# Patient Record
Sex: Male | Born: 1960 | Race: White | Hispanic: No | Marital: Married | Smoking: Never smoker
Health system: Southern US, Community
[De-identification: ages and names within clinical notes are randomized; demographics above are authoritative.]

## PROBLEM LIST (undated history)

## (undated) DIAGNOSIS — K219 Gastro-esophageal reflux disease without esophagitis: Secondary | ICD-10-CM

## (undated) DIAGNOSIS — M1990 Unspecified osteoarthritis, unspecified site: Secondary | ICD-10-CM

---

## 2018-03-19 ENCOUNTER — Other Ambulatory Visit: Payer: Self-pay

## 2018-03-19 ENCOUNTER — Encounter (HOSPITAL_BASED_OUTPATIENT_CLINIC_OR_DEPARTMENT_OTHER): Payer: Self-pay | Admitting: Emergency Medicine

## 2018-03-19 ENCOUNTER — Emergency Department (HOSPITAL_BASED_OUTPATIENT_CLINIC_OR_DEPARTMENT_OTHER)
Admission: EM | Admit: 2018-03-19 | Discharge: 2018-03-19 | Disposition: A | Discharge status: Home / Self Care | Payer: No Typology Code available for payment source | Attending: Emergency Medicine | Admitting: Emergency Medicine

## 2018-03-19 ENCOUNTER — Emergency Department (HOSPITAL_BASED_OUTPATIENT_CLINIC_OR_DEPARTMENT_OTHER): Payer: No Typology Code available for payment source

## 2018-03-19 DIAGNOSIS — W231XXA Caught, crushed, jammed, or pinched between stationary objects, initial encounter: Secondary | ICD-10-CM | POA: Diagnosis not present

## 2018-03-19 DIAGNOSIS — S67197A Crushing injury of left little finger, initial encounter: Secondary | ICD-10-CM | POA: Diagnosis present

## 2018-03-19 DIAGNOSIS — Y929 Unspecified place or not applicable: Secondary | ICD-10-CM | POA: Diagnosis not present

## 2018-03-19 DIAGNOSIS — Y9389 Activity, other specified: Secondary | ICD-10-CM | POA: Insufficient documentation

## 2018-03-19 DIAGNOSIS — Y990 Civilian activity done for income or pay: Secondary | ICD-10-CM | POA: Diagnosis not present

## 2018-03-19 DIAGNOSIS — S62637B Displaced fracture of distal phalanx of left little finger, initial encounter for open fracture: Secondary | ICD-10-CM

## 2018-03-19 HISTORY — DX: Unspecified osteoarthritis, unspecified site: M19.90

## 2018-03-19 HISTORY — DX: Gastro-esophageal reflux disease without esophagitis: K21.9

## 2018-03-19 LAB — BASIC METABOLIC PANEL
ANION GAP: 10 (ref 5–15)
BUN: 14 mg/dL (ref 6–20)
CALCIUM: 9.3 mg/dL (ref 8.9–10.3)
CHLORIDE: 102 mmol/L (ref 101–111)
CO2: 23 mmol/L (ref 22–32)
Creatinine, Ser: 1.22 mg/dL (ref 0.61–1.24)
GFR calc non Af Amer: >60 mL/min (ref >60)
Glucose, Bld: 314 mg/dL — ABNORMAL HIGH (ref 65–99)
POTASSIUM: 4.5 mmol/L (ref 3.5–5.1)
Sodium: 135 mmol/L (ref 135–145)

## 2018-03-19 LAB — CBC WITH DIFFERENTIAL/PLATELET
BASOS ABS: 0 10*3/uL (ref 0.0–0.1)
Basophils Relative: 0 %
EOS PCT: 1 %
Eosinophils Absolute: 0.1 10*3/uL (ref 0.0–0.7)
HEMATOCRIT: 43.8 % (ref 39.0–52.0)
HEMOGLOBIN: 16.1 g/dL (ref 13.0–17.0)
LYMPHS PCT: 12 %
Lymphs Abs: 0.6 10*3/uL — ABNORMAL LOW (ref 0.7–4.0)
MCH: 30.2 pg (ref 26.0–34.0)
MCHC: 36.8 g/dL — ABNORMAL HIGH (ref 30.0–36.0)
MCV: 82.2 fL (ref 78.0–100.0)
MONOS PCT: 5 %
Monocytes Absolute: 0.3 10*3/uL (ref 0.1–1.0)
NEUTROS PCT: 82 %
Neutro Abs: 4.3 10*3/uL (ref 1.7–7.7)
Platelets: 126 10*3/uL — ABNORMAL LOW (ref 150–400)
RBC: 5.33 MIL/uL (ref 4.22–5.81)
RDW: 12.4 % (ref 11.5–15.5)
WBC: 5.3 10*3/uL (ref 4.0–10.5)

## 2018-03-19 MED ORDER — CEFAZOLIN SODIUM-DEXTROSE 1-4 GM/50ML-% IV SOLN
1.0000 g | Freq: Once | INTRAVENOUS | Status: AC
  Administered 2018-03-19: 1 g via INTRAVENOUS

## 2018-03-19 MED ORDER — MORPHINE SULFATE (PF) 4 MG/ML IV SOLN
4.0000 mg | Freq: Once | INTRAVENOUS | Status: AC
  Administered 2018-03-19: 4 mg via INTRAVENOUS
  Filled 2018-03-19: qty 1

## 2018-03-19 MED ORDER — FENTANYL CITRATE (PF) 100 MCG/2ML IJ SOLN
50.0000 ug | Freq: Once | INTRAMUSCULAR | Status: AC
  Administered 2018-03-19: 50 ug via INTRAMUSCULAR
  Filled 2018-03-19: qty 2

## 2018-03-19 MED ORDER — LIDOCAINE HCL 2 % IJ SOLN
20.0000 mL | Freq: Once | INTRAMUSCULAR | Status: DC
  Filled 2018-03-19: qty 20

## 2018-03-19 MED ORDER — CEFAZOLIN SODIUM-DEXTROSE 1-4 GM/50ML-% IV SOLN
INTRAVENOUS | Status: AC
  Administered 2018-03-19: 1 g via INTRAVENOUS
  Filled 2018-03-19: qty 50

## 2018-03-19 NOTE — ED Provider Notes (Signed)
[facility] EMERGENCY DEPARTMENT Provider Note   CSN: [id number] Arrival date & time: 03/19/18  0211     History   Chief Complaint No chief complaint on file.   HPI Dillon Williamson is a 57 y.o. male.  The history is provided by the patient.  Hand Injury   The incident occurred 1 to 2 hours ago. The incident occurred at work. Injury mechanism: crushed by trailer hitch  The pain is present in the left fingers. The quality of the pain is described as aching and sharp. The pain is at a severity of 10/10. The pain is severe. The pain has been constant since the incident. Pertinent negatives include no fever. He reports no foreign bodies present. The symptoms are aggravated by movement and palpation. He has tried nothing for the symptoms. The treatment provided no relief.  Little finger crushed and pulled upon in trailer hitch at work approximately 1 hour ago.  No other injuries.  Tetanus is < 5 years.    No past medical history on file.  There are no active problems to display for this patient.     Home Medications    Prior to Admission medications   Not on File    Family History No family history on file.  Social History Social History   Tobacco Use  . Smoking status: Not on file  Substance Use Topics  . Alcohol use: Not on file  . Drug use: Not on file     Allergies   Patient has no allergy information on record.   Review of Systems Review of Systems  Constitutional: Negative for fever.  Respiratory: Negative for shortness of breath.   Cardiovascular: Negative for chest pain.  Musculoskeletal: Positive for arthralgias.  Skin: Positive for wound.  All other systems reviewed and are negative.    Physical Exam Updated Vital Signs There were no vitals taken for this visit.  Physical Exam  Constitutional: He is oriented to person, place, and time. He appears well-developed and well-nourished. No distress.  HENT:  Head: Normocephalic and  atraumatic.  Mouth/Throat: No oropharyngeal exudate.  Eyes: Pupils are equal, round, and reactive to light. Conjunctivae are normal.  Neck: Normal range of motion. Neck supple.  Cardiovascular: Normal rate, regular rhythm and normal heart sounds.  Pulmonary/Chest: Effort normal and breath sounds normal. No stridor. He has no wheezes. He has no rales.  Abdominal: Soft. Bowel sounds are normal. He exhibits no mass. There is no tenderness. There is no rebound and no guarding.  Musculoskeletal:       Left hand: He exhibits decreased capillary refill, deformity and laceration.       Hands: Neurological: He is alert and oriented to person, place, and time.  Skin: Skin is warm.  Psychiatric: He has a normal mood and affect.     ED Treatments / Results  Labs (all labs ordered are listed, but only abnormal results are displayed) Labs Reviewed - No data to display  EKG None  Radiology No results found for this or any previous visit. Dg Finger Little Left  Result Date: 03/19/2018 CLINICAL DATA:  Left fifth finger injury. Crushed in a trailer mechanism. EXAM: LEFT LITTLE FINGER 2+V COMPARISON:  None. FINDINGS: Comminuted crush fractures of the distal phalangeal tuft of the left fifth finger with overlying soft tissue defect consistent with open fracture. Proximal and middle phalanges are intact and there is no joint involvement. IMPRESSION: Comminuted crush fractures of the distal phalangeal tuft of the left fifth  finger with overlying soft tissue defect consistent with open fracture. Electronically Signed   By: Burman Nieves M.D.   On: 03/19/2018 03:22    Procedures Procedures (including critical care time)  Medications Ordered in ED Medications  ceFAZolin (ANCEF) IVPB 1 g/50 mL premix (has no administration in time range)  morphine 4 MG/ML injection 4 mg (has no administration in time range)  ceFAZolin (ANCEF) 1-4 GM/50ML-% IVPB (has no administration in time range)  fentaNYL  (SUBLIMAZE) injection 50 mcg (50 mcg Intramuscular Given 03/19/18 0250)   4 am case d/w Dr. Amanda Pea send to ED at [facility] for definitive care, call upon arrival  403 am case d/w Dr. Preston Fleeting who will accept the patient in transfer.    Final Clinical Impressions(s) / ED Diagnoses   Open fracture of distal left little finger  POV to the ED with IV in place.     Keats Kingry, MD 03/19/18 1610

## 2018-03-19 NOTE — Consult Note (Signed)
Reason for Consult: Crush injury left small finger   referring Physician: Binnie Williamson is an 57 y.o. male.  HPI: Patient presents for evaluation and treatment of his left small finger crush injury.  He works for [employer].  He crushed his left small finger and was seen at [facility].  The patient denies neck back chest or abdominal pain.  He denies right upper extremity pain.  He has had Ancef at the [place] region and then was referred for surgical care to [facility] in [place].  The patient is alert and oriented.  We reviewed his injury mechanism in his chart as well as x-rays and other issues in detail.  Past Medical History:  Diagnosis Date  . Arthritis   . GERD (gastroesophageal reflux disease)     History reviewed. No pertinent surgical history.  No family history on file.  Social History:  reports that he has never smoked. He has never used smokeless tobacco. His alcohol and drug histories are not on file.  Allergies:  Allergies  Allergen Reactions  . Hydroxychloroquine     Medications: I have reviewed the patient's current medications.  Results for orders placed or performed during the hospital encounter of 03/19/18 (from the past 48 hour(s))  CBC with Differential/Platelet     Status: Abnormal   Collection Time: 03/19/18  4:11 AM  Result Value Ref Range   WBC 5.3 4.0 - 10.5 K/uL   RBC 5.33 4.22 - 5.81 MIL/uL   Hemoglobin 16.1 13.0 - 17.0 g/dL   HCT 43.8 39.0 - 52.0 %   MCV 82.2 78.0 - 100.0 fL   MCH 30.2 26.0 - 34.0 pg   MCHC 36.8 (H) 30.0 - 36.0 g/dL   RDW 12.4 11.5 - 15.5 %   Platelets 126 (L) 150 - 400 K/uL   Neutrophils Relative % 82 %   Lymphocytes Relative 12 %   Monocytes Relative 5 %   Eosinophils Relative 1 %   Basophils Relative 0 %   Neutro Abs 4.3 1.7 - 7.7 K/uL   Lymphs Abs 0.6 (L) 0.7 - 4.0 K/uL   Monocytes Absolute 0.3 0.1 - 1.0 K/uL   Eosinophils Absolute 0.1 0.0 - 0.7 K/uL   Basophils  Absolute 0.0 0.0 - 0.1 K/uL   Smear Review MORPHOLOGY UNREMARKABLE     Comment: Performed at [facility], [address] [place], [place] [address]  Basic metabolic panel     Status: Abnormal   Collection Time: 03/19/18  4:11 AM  Result Value Ref Range   Sodium 135 135 - 145 mmol/L   Potassium 4.5 3.5 - 5.1 mmol/L   Chloride 102 101 - 111 mmol/L   CO2 23 22 - 32 mmol/L   Glucose, Bld 314 (H) 65 - 99 mg/dL   BUN 14 6 - 20 mg/dL   Creatinine, Ser 1.22 0.61 - 1.24 mg/dL   Calcium 9.3 8.9 - 10.3 mg/dL   GFR calc non Af Amer >60 >60 mL/min   GFR calc Af Amer >60 >60 mL/min    Comment: (NOTE) The eGFR has been calculated using the CKD EPI equation. This calculation has not been validated in all clinical situations. eGFR's persistently <60 mL/min signify possible Chronic Kidney Disease.    Anion gap 10 5 - 15    Comment: Performed at [facility], [address] [place], [place] [address]    Dg Finger Little Left  Result Date: 03/19/2018 CLINICAL DATA:  Left fifth finger injury. Crushed in a trailer mechanism. EXAM: LEFT LITTLE FINGER 2+V COMPARISON:  None. FINDINGS: Comminuted crush fractures of the distal phalangeal tuft of the left fifth finger with overlying soft tissue defect consistent with open fracture. Proximal and middle phalanges are intact and there is no joint involvement. IMPRESSION: Comminuted crush fractures of the distal phalangeal tuft of the left fifth finger with overlying soft tissue defect consistent with open fracture. Electronically Signed   By: Dillon Williamson M.D.   On: 03/19/2018 03:22    Review of Systems  Respiratory: Negative.   Cardiovascular: Negative.   Gastrointestinal: Negative.   Genitourinary: Negative.   Endo/Heme/Allergies: Negative.   Psychiatric/Behavioral: Negative.    Blood pressure (!) 158/98, pulse 80, temperature 98.5 F (36.9 C), resp. rate 16, height 5' 8"  (1.727 m), weight 104.3 kg (230 lb), SpO2 97  %. Physical Exam Crush injury left small finger with exposed bone and avulsed nail as well as severe nailbed injury.  There is no signs of infection.  The ring middle and index fingers are nontender.  I reviewed this with him at length.  His x-rays do show comminuted fractures about the fifth finger with soft tissue deficit.   The patient is alert and oriented in no acute distress. The patient complains of pain in the affected upper extremity.  The patient is noted to have a normal HEENT exam. Lung fields show equal chest expansion and no shortness of breath. Abdomen exam is nontender without distention. Lower extremity examination does not show any fracture dislocation or blood clot symptoms. Pelvis is stable and the neck and back are stable and nontender.  Assessment/Plan: Crush injury left small finger with tissue loss, nailbed and nail plate loss as well as a comminuted fracture.  We will plan for surgical reconstructive efforts.   We are planning surgery for your upper extremity. The risk and benefits of surgery to include risk of bleeding, infection, anesthesia,  damage to normal structures and failure of the surgery to accomplish its intended goals of relieving symptoms and restoring function have been discussed in detail. With this in mind we plan to proceed. I have specifically discussed with the patient the pre-and postoperative regime and the dos and don'ts and risk and benefits in great detail. Risk and benefits of surgery also include risk of dystrophy(CRPS), chronic nerve pain, failure of the healing process to go onto completion and other inherent risks of surgery The relavent the pathophysiology of the disease/injury process, as well as the alternatives for treatment and postoperative course of action has been discussed in great detail with the patient who desires to proceed.  We will do everything in our power to help you (the patient) restore function to the upper extremity. It is  a pleasure to see this patient today.   03/19/2018  8:23 AM  PATIENT:  Dillon Williamson    PRE-OPERATIVE DIAGNOSIS: Crush injury left small finger  POST-OPERATIVE DIAGNOSIS:  Same  PROCEDURE: #1 irrigation and debridement left small finger skin subtenons tissue bone and associated deep soft tissue including nailbed, #2 open treatment distal phalanx fracture left small finger #3 nailbed repair complex in nature left small finger #4 volar advancement flap left small finger  SURGEON:  Paulene Floor, MD  PHYSICIAN ASSISTANT: None  ANESTHESIA: Local block  PREOPERATIVE INDICATIONS:  Cavan Bearden is a  57 y.o. male with a diagnosis of   The risks benefits and alternatives were discussed with  the patient preoperatively including but not limited to the risks of infection, bleeding, nerve injury, cardiopulmonary complications, the need for revision surgery, among others, and the patient was willing to proceed.   OPERATIVE PROCEDURE: Patient was seen sterilely prepped and draped in the sterile fashion after a intermetacarpal block was performed with lidocaine without epinephrine. Patient then had nail plate removed with Desiree Hane. Following this the patient went irrigation debridement and open fracture about the distal phalanx. This was an excisional debridement of a open fracture performed with curette knife blade and scissor. Following this patient underwent setting technique of the distal phalanx with open treatment of distal phalanx fracture. Following this the patient underwent meticulous repair of the nail bed with 4-0 loupe magnification and 6-0 chromic suture for the complex repair. Next the lateral nail fold was repaired. Following this Adaptic was placed under the eponychial fold to prevent nailbed adherent and the patient was dressed sterilely.  I should note the patient underwent a volar advancement flap to provide coverage.  This was performed with a regime of step  cutting of the affected area followed by advancing the flap dorsally over the exposed bone.  There were no complicating features.  The patient tolerated this well.  The nailbed was rather significantly injured with a comminuted area and I would expect some degree of unusual nailbed growth/hard nail growth as time plays out.  Nevertheless I think over the next 6 months he is likely the regrowing nail.  Thus the patient underwent irrigation and debridement of an open fracture no plate removal, nailbed repair, open treatment of distal phalanx fracture. He was discharged home on Bactrim DS 1 p.o. twice daily x14 days and Norco 1-2 every 4 to 6 hours as needed pain p.o. dispense #40.  I will see him back in the office in 8 to 10 days for therapeutic endeavors.  I will see him back personally in 2-2 and half weeks.  I written him an out of work note until I see him.  He understands all issues.  He left the ER awake alert and oriented  Keep bandage clean and dry.  Call for any problems.  No smoking.  Criteria for driving a car: you should be off your pain medicine for 7-8 hours, able to drive one handed(confident), thinking clearly and feeling able in your judgement to drive. Continue elevation as it will decrease swelling.  If instructed by MD move your fingers within the confines of the bandage/splint.  Use ice if instructed by your MD. Call immediately for any sudden loss of feeling in your hand/arm or change in functional abilities of the extremity.  We recommend that you to take vitamin C 1000 mg a day to promote healing. We also recommend that if you require  pain medicine that you take a stool softener to prevent constipation as most pain medicines will have constipation side effects. We recommend either Peri-Colace or Senokot and recommend that you also consider adding MiraLAX as well to prevent the constipation affects from pain medicine if you are required to use them. These medicines are over the  counter and may be purchased at a local pharmacy. A cup of yogurt and a probiotic can also be helpful during the recovery process as the medicines can disrupt your intestinal environment.  Satira Anis Kanoelani Dobies III 03/19/2018, 8:20 AM

## 2018-03-19 NOTE — ED Triage Notes (Signed)
Left pinky finger injury.  Crushed pinky in trailer mechanism.  Bleeding controlled.

## 2018-03-19 NOTE — ED Notes (Signed)
IV wrapped for transport

## 2018-03-19 NOTE — ED Notes (Signed)
Pt states he does NOT need a urine drug test nor does he need a breath alcohol test.

## 2018-10-22 IMAGING — DX DG FINGER LITTLE 2+V*L*
3 series · 3 of 3 positions shown · non-contrast
Comparison: None.

CLINICAL DATA: Left fifth finger injury. Crushed in a trailer
mechanism.

EXAM:
LEFT LITTLE FINGER 2+V

[finger ap]
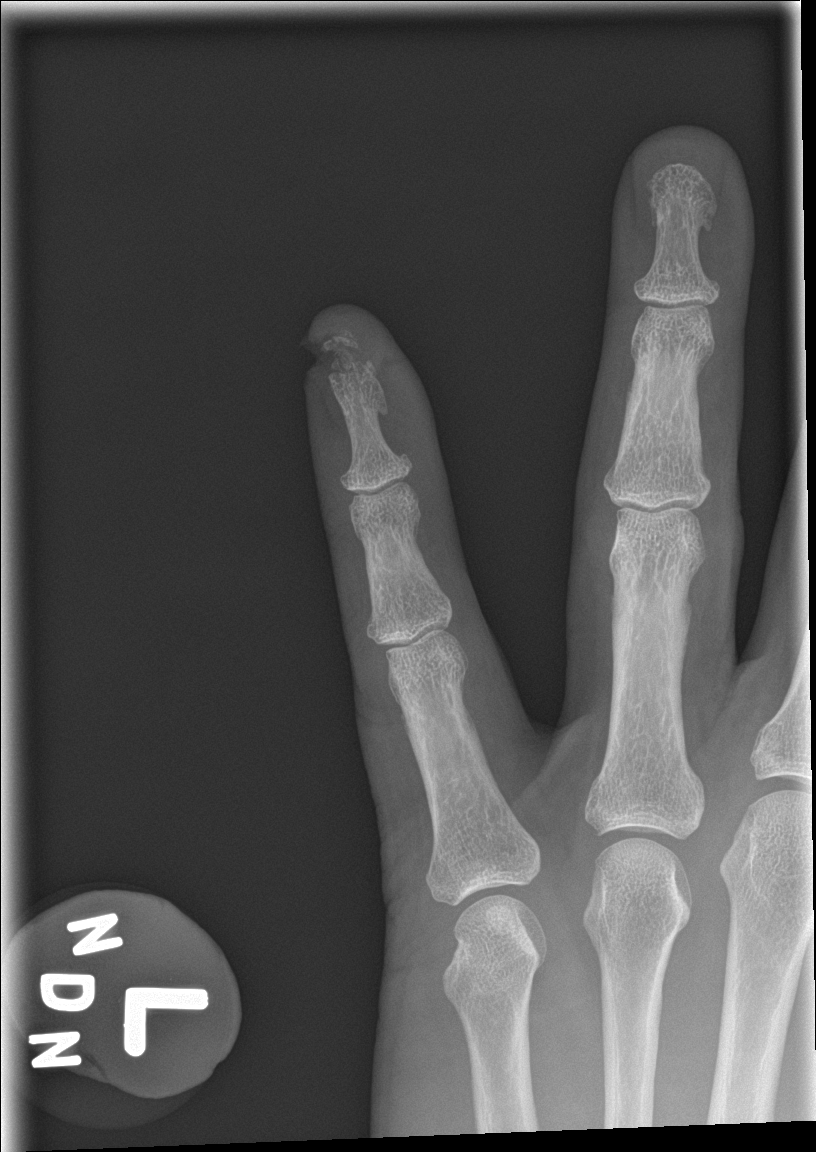

[finger obl]
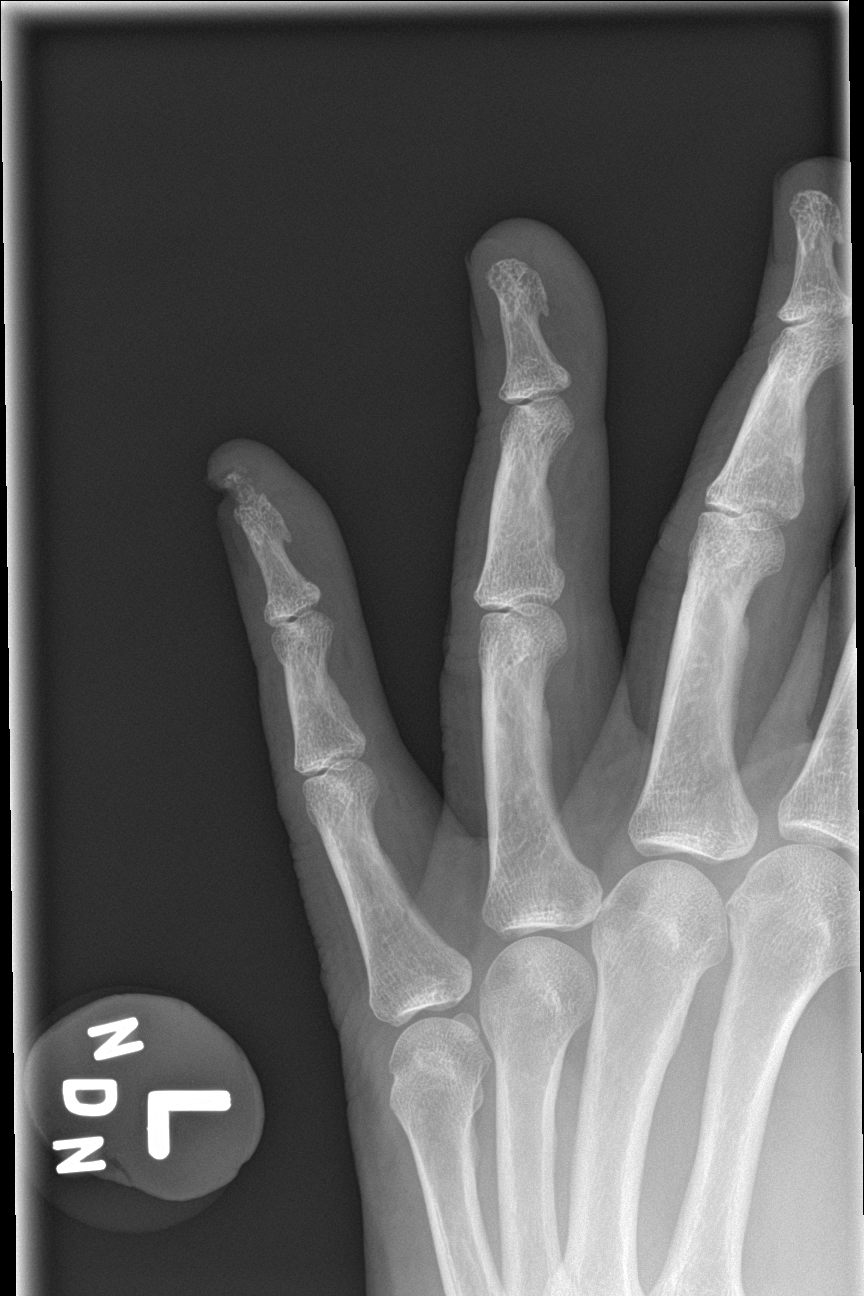

[finger lat]
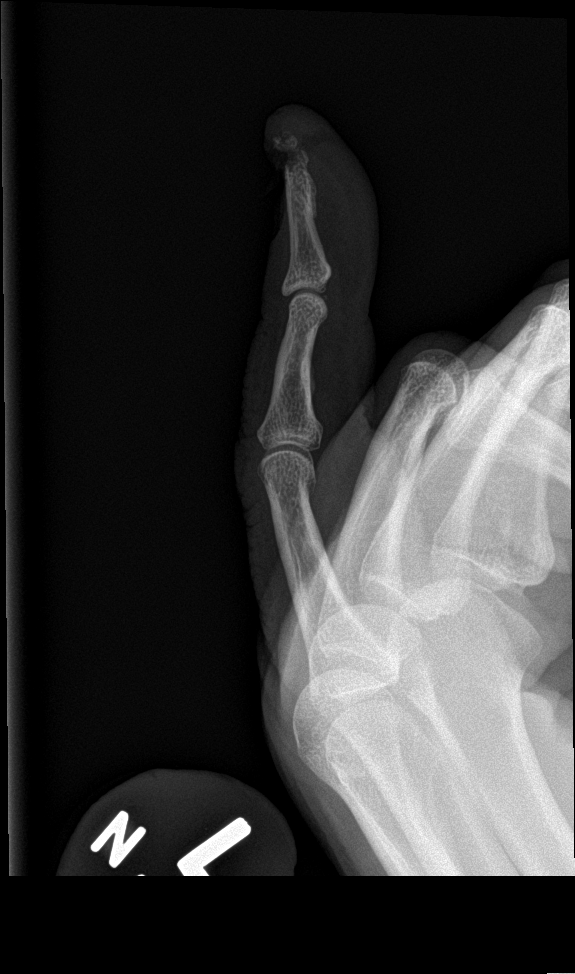

[3 of 3 positions shown; findings below may reference images not displayed]

FINDINGS: Comminuted crush fractures of the distal phalangeal tuft of the left
fifth finger with overlying soft tissue defect consistent with open
fracture. Proximal and middle phalanges are intact and there is no
joint involvement.
IMPRESSION: Comminuted crush fractures of the distal phalangeal tuft of the left
fifth finger with overlying soft tissue defect consistent with open
fracture.
